# Patient Record
Sex: Male | Born: 1940 | Race: White | Hispanic: No | Smoking: Never smoker
Health system: Southern US, Community
[De-identification: ages and names within clinical notes are randomized; demographics above are authoritative.]

## PROBLEM LIST (undated history)

## (undated) DIAGNOSIS — E119 Type 2 diabetes mellitus without complications: Secondary | ICD-10-CM

## (undated) DIAGNOSIS — M1990 Unspecified osteoarthritis, unspecified site: Secondary | ICD-10-CM

## (undated) DIAGNOSIS — I10 Essential (primary) hypertension: Secondary | ICD-10-CM

## (undated) HISTORY — PX: FOOT SURGERY: SHX648

## (undated) HISTORY — PX: HERNIA REPAIR: SHX51

---

## 2015-09-25 ENCOUNTER — Encounter (HOSPITAL_BASED_OUTPATIENT_CLINIC_OR_DEPARTMENT_OTHER): Payer: Self-pay | Attending: Internal Medicine

## 2016-02-22 ENCOUNTER — Emergency Department (HOSPITAL_BASED_OUTPATIENT_CLINIC_OR_DEPARTMENT_OTHER)
Admission: EM | Admit: 2016-02-22 | Discharge: 2016-02-23 | Disposition: A | Discharge status: Home / Self Care | Payer: Self-pay | Attending: Emergency Medicine | Admitting: Emergency Medicine

## 2016-02-22 ENCOUNTER — Encounter (HOSPITAL_BASED_OUTPATIENT_CLINIC_OR_DEPARTMENT_OTHER): Payer: Self-pay | Admitting: *Deleted

## 2016-02-22 ENCOUNTER — Emergency Department (HOSPITAL_BASED_OUTPATIENT_CLINIC_OR_DEPARTMENT_OTHER): Payer: Self-pay

## 2016-02-22 DIAGNOSIS — L539 Erythematous condition, unspecified: Secondary | ICD-10-CM | POA: Insufficient documentation

## 2016-02-22 DIAGNOSIS — E119 Type 2 diabetes mellitus without complications: Secondary | ICD-10-CM | POA: Insufficient documentation

## 2016-02-22 DIAGNOSIS — L03211 Cellulitis of face: Secondary | ICD-10-CM | POA: Insufficient documentation

## 2016-02-22 DIAGNOSIS — L0100 Impetigo, unspecified: Secondary | ICD-10-CM | POA: Insufficient documentation

## 2016-02-22 DIAGNOSIS — J069 Acute upper respiratory infection, unspecified: Secondary | ICD-10-CM | POA: Insufficient documentation

## 2016-02-22 DIAGNOSIS — H109 Unspecified conjunctivitis: Secondary | ICD-10-CM | POA: Insufficient documentation

## 2016-02-22 DIAGNOSIS — I10 Essential (primary) hypertension: Secondary | ICD-10-CM | POA: Insufficient documentation

## 2016-02-22 DIAGNOSIS — Z8739 Personal history of other diseases of the musculoskeletal system and connective tissue: Secondary | ICD-10-CM | POA: Insufficient documentation

## 2016-02-22 HISTORY — DX: Unspecified osteoarthritis, unspecified site: M19.90

## 2016-02-22 HISTORY — DX: Type 2 diabetes mellitus without complications: E11.9

## 2016-02-22 HISTORY — DX: Essential (primary) hypertension: I10

## 2016-02-22 LAB — COMPREHENSIVE METABOLIC PANEL
ALBUMIN: 4.2 g/dL (ref 3.5–5.0)
ALK PHOS: 84 U/L (ref 38–126)
ALT: 38 U/L (ref 17–63)
AST: 32 U/L (ref 15–41)
Anion gap: 9 (ref 5–15)
BUN: 30 mg/dL — AB (ref 6–20)
CALCIUM: 9.3 mg/dL (ref 8.9–10.3)
CHLORIDE: 109 mmol/L (ref 101–111)
CO2: 20 mmol/L — AB (ref 22–32)
CREATININE: 1.73 mg/dL — AB (ref 0.61–1.24)
GFR calc non Af Amer: 37 mL/min — ABNORMAL LOW (ref >60)
GFR, EST AFRICAN AMERICAN: 43 mL/min — AB (ref >60)
GLUCOSE: 82 mg/dL (ref 65–99)
Potassium: 4.3 mmol/L (ref 3.5–5.1)
Sodium: 138 mmol/L (ref 135–145)
Total Bilirubin: 0.6 mg/dL (ref 0.3–1.2)
Total Protein: 7.2 g/dL (ref 6.5–8.1)

## 2016-02-22 LAB — CBC WITH DIFFERENTIAL/PLATELET
Basophils Absolute: 0 10*3/uL (ref 0.0–0.1)
Basophils Relative: 0 %
EOS ABS: 0.2 10*3/uL (ref 0.0–0.7)
Eosinophils Relative: 1 %
HCT: 42.8 % (ref 39.0–52.0)
Hemoglobin: 14.5 g/dL (ref 13.0–17.0)
Lymphocytes Relative: 59 %
Lymphs Abs: 9.3 10*3/uL — ABNORMAL HIGH (ref 0.7–4.0)
MCH: 29.6 pg (ref 26.0–34.0)
MCHC: 33.9 g/dL (ref 30.0–36.0)
MCV: 87.3 fL (ref 78.0–100.0)
MONO ABS: 1.6 10*3/uL — AB (ref 0.1–1.0)
Monocytes Relative: 10 %
NEUTROS PCT: 30 %
Neutro Abs: 4.7 10*3/uL (ref 1.7–7.7)
PLATELETS: 149 10*3/uL — AB (ref 150–400)
RBC: 4.9 MIL/uL (ref 4.22–5.81)
RDW: 14.8 % (ref 11.5–15.5)
WBC: 15.8 10*3/uL — AB (ref 4.0–10.5)

## 2016-02-22 LAB — CBG MONITORING, ED
GLUCOSE-CAPILLARY: 96 mg/dL (ref 65–99)
Glucose-Capillary: 67 mg/dL (ref 65–99)

## 2016-02-22 MED ORDER — ERYTHROMYCIN 5 MG/GM OP OINT: TOPICAL_OINTMENT | OPHTHALMIC | Status: AC

## 2016-02-22 MED ORDER — TETRACAINE HCL 0.5 % OP SOLN
2.0000 [drp] | Freq: Once | OPHTHALMIC | Status: AC
  Administered 2016-02-22: 2 [drp] via OPHTHALMIC
  Filled 2016-02-22: qty 4

## 2016-02-22 MED ORDER — MUPIROCIN CALCIUM 2 % EX CREA: 1.0000 "application " | TOPICAL_CREAM | Freq: Two times a day (BID) | CUTANEOUS | Status: AC

## 2016-02-22 MED ORDER — SODIUM CHLORIDE 0.9 % IV BOLUS (SEPSIS)
1000.0000 mL | Freq: Once | INTRAVENOUS | Status: AC
  Administered 2016-02-22: 1000 mL via INTRAVENOUS

## 2016-02-22 MED ORDER — CLINDAMYCIN HCL 150 MG PO CAPS: 300.0000 mg | ORAL_CAPSULE | Freq: Three times a day (TID) | ORAL | Status: AC

## 2016-02-22 MED ORDER — FLUORESCEIN SODIUM 1 MG OP STRP
1.0000 | ORAL_STRIP | Freq: Once | OPHTHALMIC | Status: AC
  Administered 2016-02-22: 1 via OPHTHALMIC
  Filled 2016-02-22: qty 1

## 2016-02-22 NOTE — Discharge Instructions (Signed)

## 2016-02-22 NOTE — ED Provider Notes (Signed)
CSN: [id number]     Arrival date & time 02/22/16  2010 History  By signing my name below, I, Terrance Branch, attest that this documentation has been prepared under the direction and in the presence of Alvira MondayErin Emiliano Welshans, MD. Electronically Signed: Evon Slackerrance Branch, ED Scribe. 02/22/2016. 12:25 PM.     Chief Complaint  Patient presents with  . Eye Problem   Patient is a 75 y.o. male presenting with eye problem. The history is provided by the patient. No language interpreter was used.  Eye Problem Associated symptoms: discharge, itching and redness   Associated symptoms: no headaches and no photophobia    HPI Comments: Ryan Crosby is a 75 y.o. male who presents to the Emergency Department complaining of redness around his bilateral eyes onset 1 week prior. Pt reports that he has associated itching, drainage and blurred vision. Pt states that he has been washing his face with no relief. Pt states that the crusting around his eye is worse in the morning when waking up. Pt states that he has also noticed some drainage coming from his neck. Pt states that he has tried benadryl with no relief. Denies eye pain, photophobia, rhinorrhea, ear pain, fever. Reports recent sick contacts. Pt reports having URI symptoms about 2 weeks prior and all symptoms have resolved. Pt does report Hx of cataract surgery.   Blurred vision began today-described as blurriness. No visual field cuts/double vision. No pain with eye movements.  Chronic asymmetric vision however does not know rx or have glasses with him.     Past Medical History  Diagnosis Date  . Hypertension   . Arthritis   . Diabetes mellitus without complication [facility](HCC)    Past Surgical History  Procedure Laterality Date  . Foot surgery    . Hernia repair     No family history on file. Social History  Substance Use Topics  . Smoking status: Never Smoker   . Smokeless tobacco: Never Used  . Alcohol Use: No    Review of Systems  Constitutional:  Negative for fever.  HENT: Negative for congestion (improved (was present 1-2 weeks ago), ear pain, rhinorrhea and sore throat.   Eyes: Positive for discharge, redness, itching and visual disturbance (blurred). Negative for photophobia and pain.  Respiratory: Negative for cough (improved, was present 1-2 weeks ago) and shortness of breath.   Cardiovascular: Negative for chest pain.  Gastrointestinal: Negative for abdominal pain.  Genitourinary: Negative for difficulty urinating.  Musculoskeletal: Negative for back pain and neck stiffness.  Skin: Negative for rash.  Neurological: Negative for syncope and headaches.  All other systems reviewed and are negative.     Allergies  Review of patient's allergies indicates no known allergies.  Home Medications   Prior to Admission medications   Medication Sig Start Date End Date Taking? Authorizing Provider  clindamycin (CLEOCIN) 150 MG capsule Take 2 capsules (300 mg total) by mouth 3 (three) times daily. 02/22/16 02/29/16  Alvira MondayErin Syniah Berne, MD  erythromycin ophthalmic ointment Place a 1/2 inch ribbon of ointment into the lower eyelid four times daily for one week. 02/22/16   Alvira MondayErin Allyah Heather, MD  mupirocin cream (BACTROBAN) 2 % Apply 1 application topically 2 (two) times daily. To area of your chin 02/22/16   Alvira MondayErin Wells Gerdeman, MD   BP 162/75 mmHg  Pulse 78  Temp(Src) 98.2 F (36.8 C) (Oral)  Resp 16  Ht 5\' 9"  (1.753 m)  Wt 150 lb (68.04 kg)  BMI 22.14 kg/m2  SpO2 96%   Physical Exam  Constitutional: He is oriented to person, place, and time. He appears well-developed and well-nourished. No distress.  HENT:  Head: Normocephalic and atraumatic.  Eyes: Right eye exhibits discharge. Left eye exhibits discharge. Right conjunctiva is injected. Left conjunctiva is injected. Right eye exhibits abnormal extraocular motion. Left eye exhibits normal extraocular motion. Right pupil is round and reactive. Left pupil is round and reactive. Pupils are equal.   Mild periorbital edema Bilateral periorbital erythema Dry scaling erythematous lesion right forehead contiguous with periorbital erythema No proptosis  IOP L 23/24 IOP R 16/17  Fluorescein stain, no abrasions  Neck: Neck supple. No tracheal deviation present.  Neck directly under chin, Area of erythema with honey crusting and clear drainage no fluctuance   Cardiovascular: Normal rate.   Pulmonary/Chest: Effort normal. No respiratory distress.  Musculoskeletal: Normal range of motion.  Neurological: He is alert and oriented to person, place, and time.  Skin: Skin is warm and dry.  Psychiatric: He has a normal mood and affect. His behavior is normal.  Nursing note and vitals reviewed.   ED Course  Procedures (including critical care time) DIAGNOSTIC STUDIES: Oxygen Saturation is 98% on RA, normal by my interpretation.    COORDINATION OF CARE: 9:15 PM-Discussed treatment plan with pt at bedside and pt agreed to plan.     Labs Review Labs Reviewed  CBC WITH DIFFERENTIAL/PLATELET - Abnormal; Notable for the following:    WBC 15.8 (*)    Platelets 149 (*)    Lymphs Abs 9.3 (*)    Monocytes Absolute 1.6 (*)    All other components within normal limits  COMPREHENSIVE METABOLIC PANEL - Abnormal; Notable for the following:    CO2 20 (*)    BUN 30 (*)    Creatinine, Ser 1.73 (*)    GFR calc non Af Amer 37 (*)    GFR calc Af Amer 43 (*)    All other components within normal limits  CULTURE, BLOOD (ROUTINE X 2)  CULTURE, BLOOD (ROUTINE X 2)  PATHOLOGIST SMEAR REVIEW  CBG MONITORING, ED  CBG MONITORING, ED    Imaging Review Ct Orbitss W/o Cm  02/22/2016  CLINICAL DATA:  Bilateral eye redness for 1 week, itching and drainage, blurry vision. Recent upper respiratory tract infection. History of cataract surgery. EXAM: CT ORBITS WITHOUT CONTRAST TECHNIQUE: Multidetector CT imaging of the orbits was performed following the standard protocol without intravenous contrast.  COMPARISON:  None. FINDINGS: Ocular globes intact, status post bilateral ocular lens implants. Preservation of the orbital fat. Normal appearance of the optic nerve sheath complexes and extra-ocular muscles. Superior ophthalmic veins are not enlarged. Moderate paranasal sinus mucosal thickening with RIGHT maxillary mucosal retention cyst. 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 No destructive bony lesions. Included intracranial contents are normal for age. Included soft tissues are nonsuspicious, however there is the vitamin E capsule versus pedunculated subcentimeter nodule RIGHT facial soft tissues. IMPRESSION: Status post bilateral ocular lens implants. Otherwise negative CT orbits. Moderate paranasal sinusitis. Electronically Signed   By: Awilda Metro M.D.   On: 02/22/2016 23:19      EKG Interpretation None      MDM   Final diagnoses:  Erythema  Bilateral conjunctivitis  Impetigo  Cellulitis of face  URI (upper respiratory infection)   75yo male with history of DM, cataract surgery, htn presents with concern for redness around both eyes, drainage from eyes, drainage from below his chin, and blurred vision beginning today.  DDx  includes viral/bacterial conjunctivitis with surrounding eye irritation, allergies, preseptal cellulitis, orbital cellulitis, impetigo.  Normal pressure in both eyes, no sign of corneal abrasion. Clinical history and physical exam overall consistent with conjunctivitis with surrounding eye irritation and area of impetigo over chin, however, patient reports new blurred vision and given his erythema surrounding both eyes, hx of DM, , and recent URI symptoms as risk factor, performed labwork and scan to evaluate for orbital cellulitis.  CT does not show evidence of orbital cellulitis, however it does show sinusitis. WBC 15.8.  Given risk factors, WBC, appearance of possible preseptal cellulitis, will  treat with clindamycin for 1 week. In addition, provided mupirocin for area of chin concerning for impetigo and erythromycin ointment for possible bacterial conjunctivitis.  Given report of blurred vision as well as DM history, referred patient to Ophthalmologist to establish care and evaluate acute changes. Patient discharged in stable condition with understanding of reasons to return.   I personally performed the services described in this documentation, which was scribed in my presence. The recorded information has been reviewed and is accurate.   Alvira Monday, MD 02/23/16 [phone]

## 2016-02-22 NOTE — ED Notes (Signed)
1 week of redness around both eyes, with drainage. Also reports wax drainage from his ears

## 2016-02-24 LAB — PATHOLOGIST SMEAR REVIEW

## 2016-02-28 LAB — CULTURE, BLOOD (ROUTINE X 2)
CULTURE: NO GROWTH
Culture: NO GROWTH

## 2017-03-31 IMAGING — CT CT ORBITS W/O CM
3 series · 12 of 47 positions shown, 14 images · non-contrast
Comparison: None.

CLINICAL DATA: Bilateral eye redness for 1 week, itching and
drainage, blurry vision. Recent upper respiratory tract infection.
History of cataract surgery.

EXAM:
CT ORBITS WITHOUT CONTRAST
TECHNIQUE: Multidetector CT imaging of the orbits was performed following the
standard protocol without intravenous contrast.

[Series 3: orbits 2.0 h30s st · axial · 0.36mm/px · z∈[-88,-24]mm · 6 of 42 slices shown, 8 images]
[im 5/42  brain]
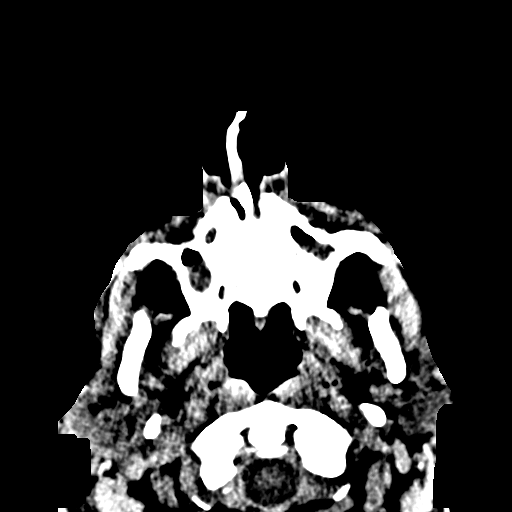
[im 5/42  bone]
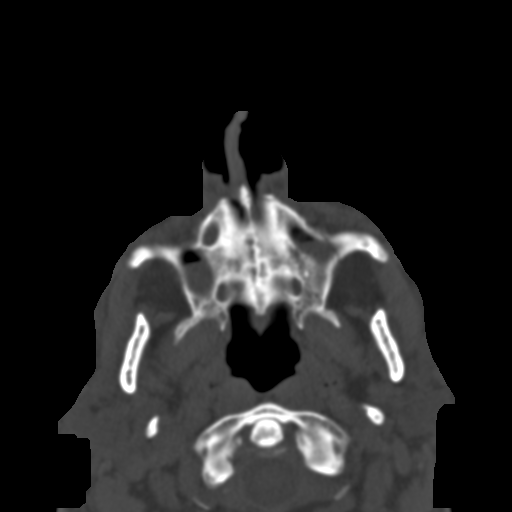
[im 12/42  bone]
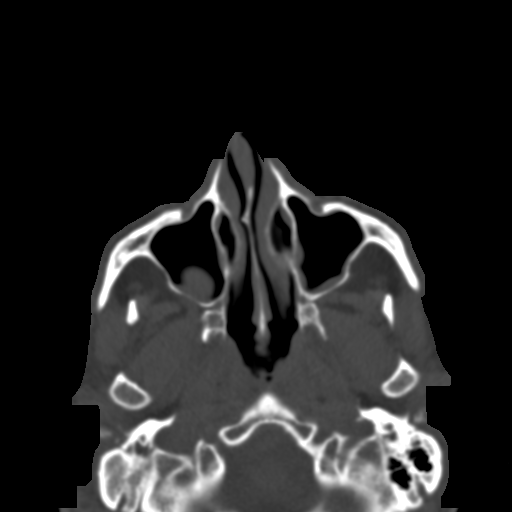
[im 17/42  bone]
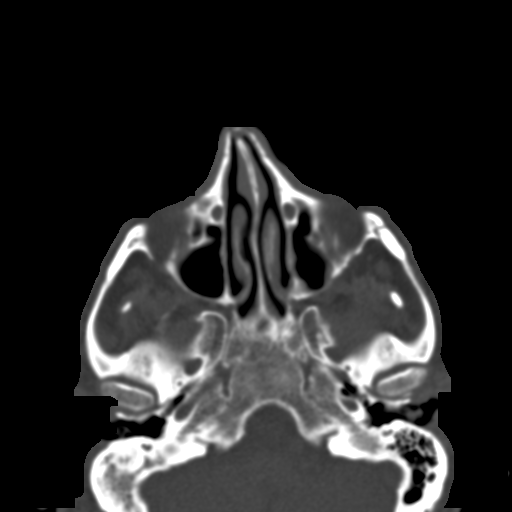
[im 25/42  bone]
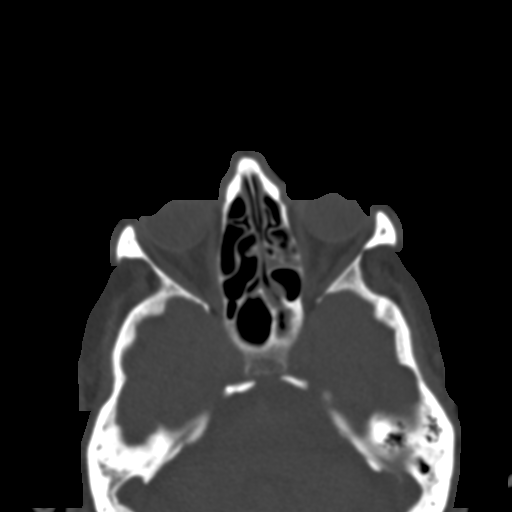
[im 32/42  brain]
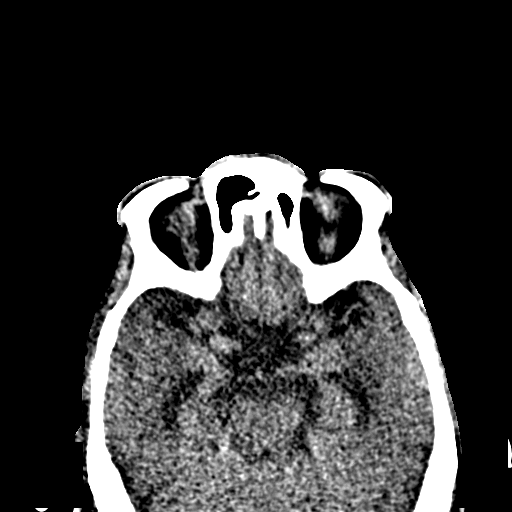
[im 32/42  bone]
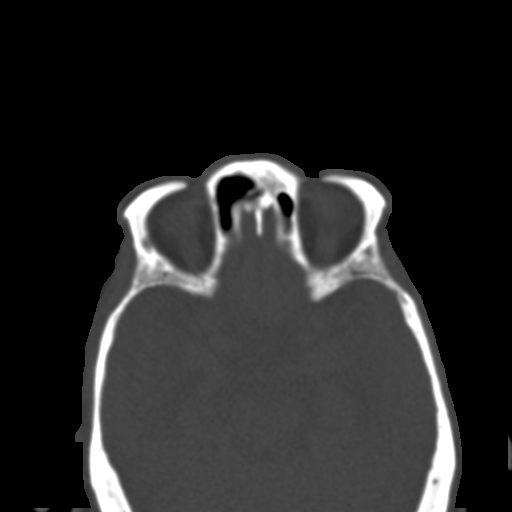
[im 37/42  bone]
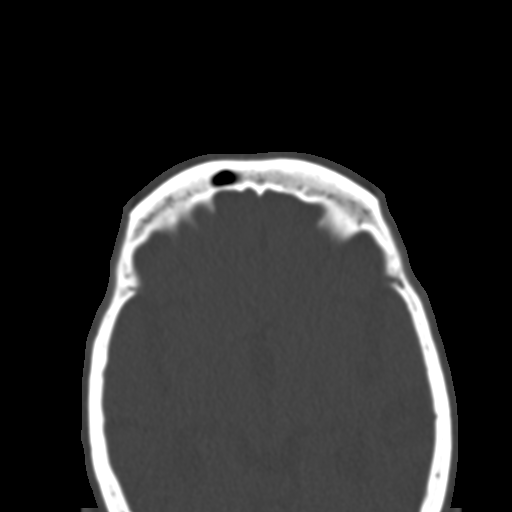

[Series 8: orbits 2.0 coronal · coronal · 0.19mm/px · 3 of 70 slices shown]
[im 24/70  bone]
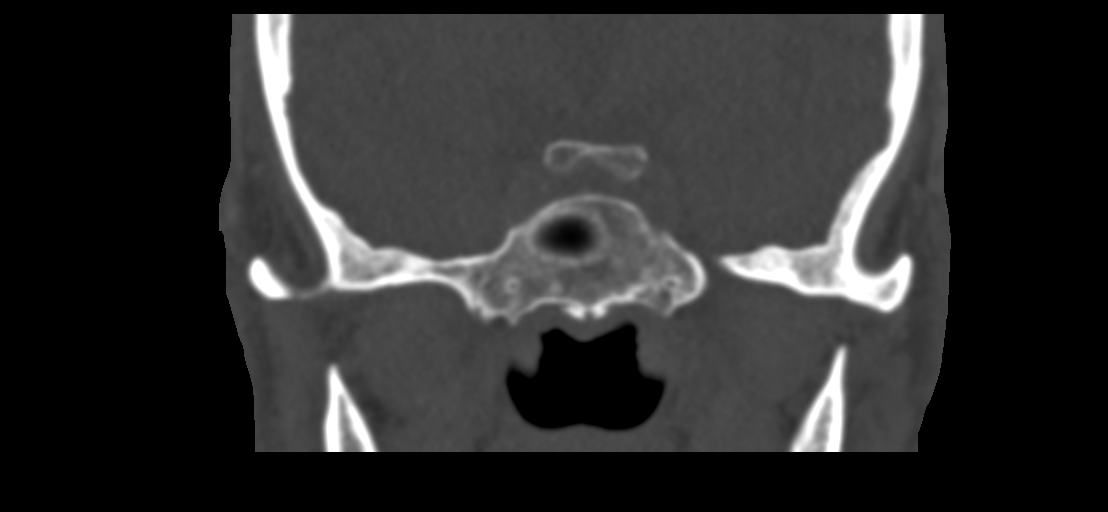
[im 31/70  bone]
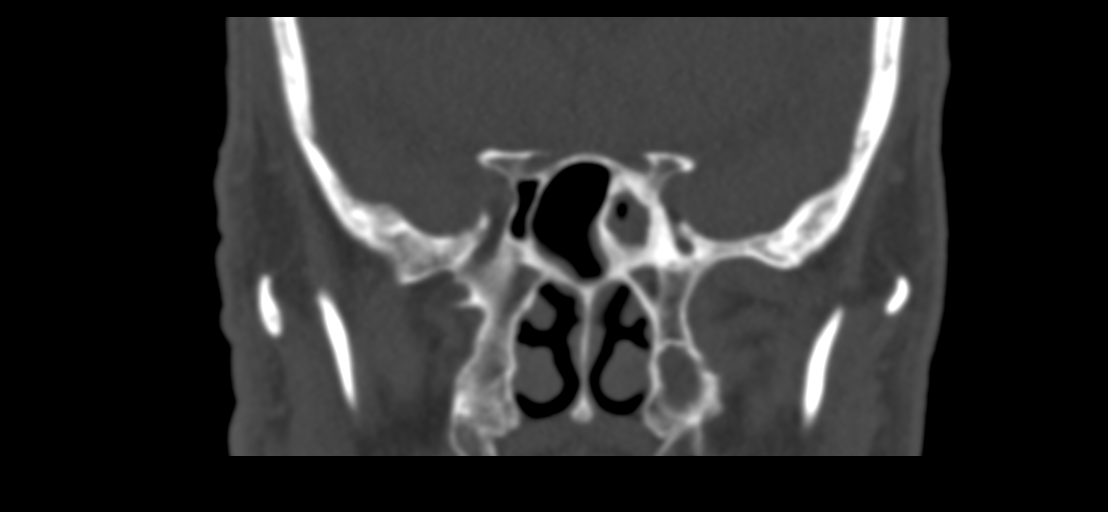
[im 39/70  bone]
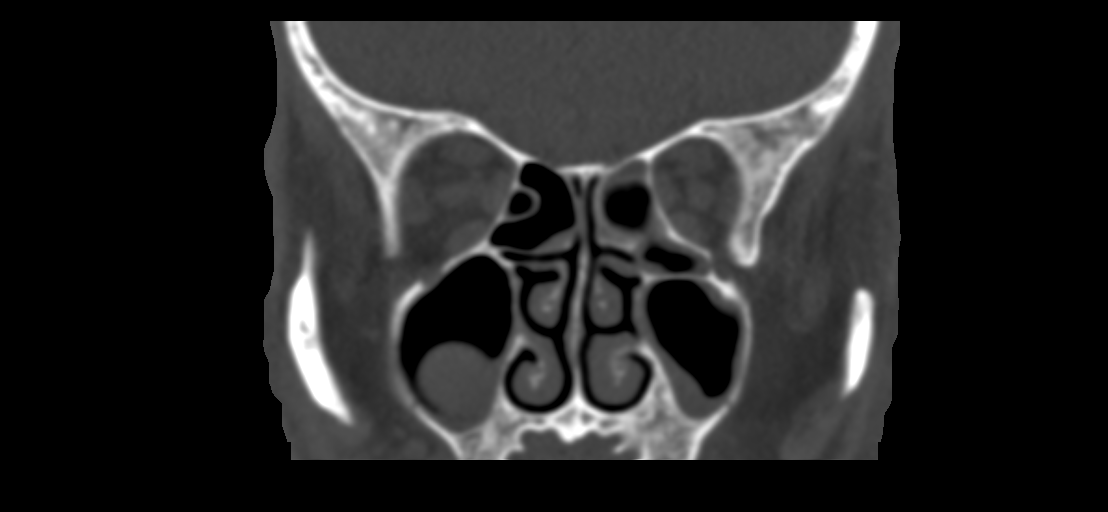

[Series 9: orbits 2.0 sagittal · sagittal · 0.19mm/px · 3 of 84 slices shown]
[im 28/84  bone]
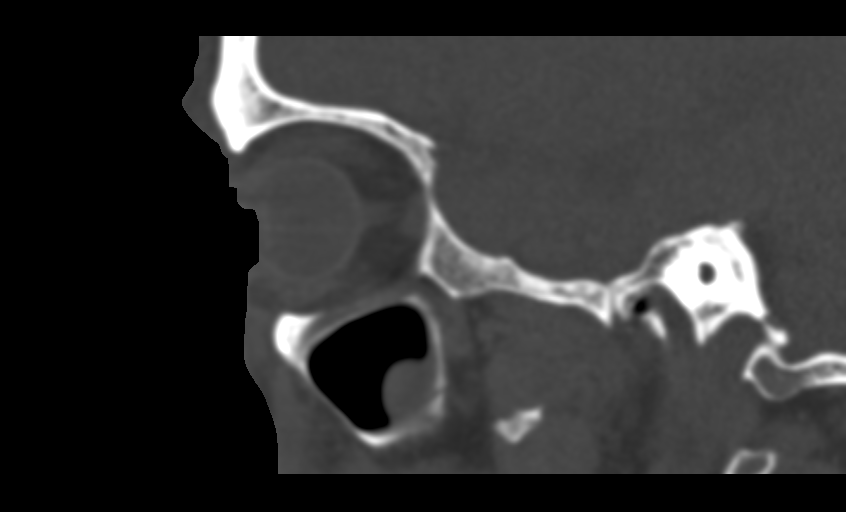
[im 42/84  bone]
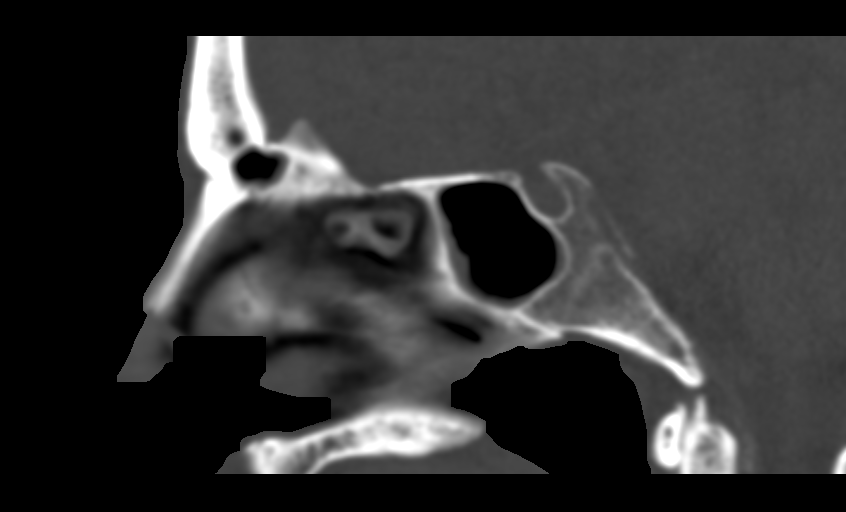
[im 56/84  bone]
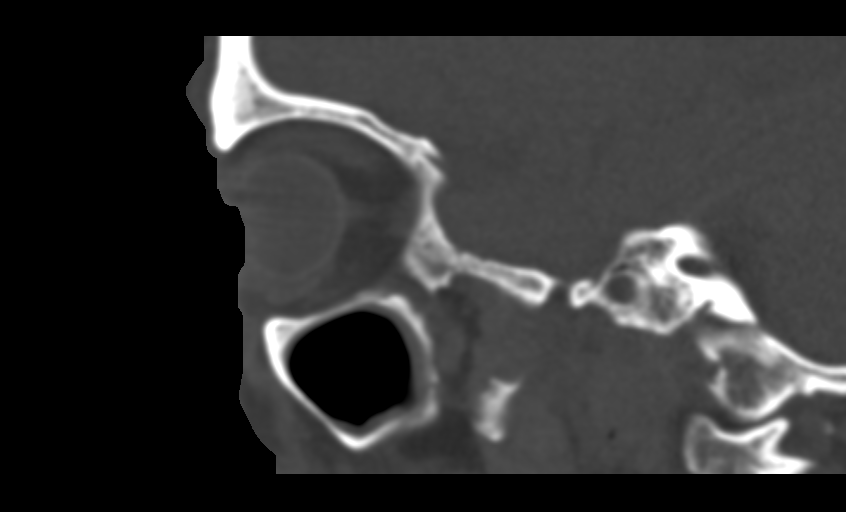

[12 of 47 positions shown; findings below may reference images not displayed]

FINDINGS: Ocular globes intact, status post bilateral ocular lens implants.
Preservation of the orbital fat. Normal appearance of the optic
nerve sheath complexes and extra-ocular muscles. Superior ophthalmic
veins are not enlarged.

Moderate paranasal sinus mucosal thickening with RIGHT maxillary
mucosal retention cyst. Under pneumatized RIGHT mastoid air cell
suggest sequelae of chronic mastoiditis without acute component. No
destructive bony lesions.

Included intracranial contents are normal for age. Included soft
tissues are nonsuspicious, however there is the vitamin E capsule
versus pedunculated subcentimeter nodule RIGHT facial soft tissues.
IMPRESSION: Status post bilateral ocular lens implants. Otherwise negative CT
orbits.

Moderate paranasal sinusitis.

## 2021-01-21 DEATH — deceased
